# Patient Record
Sex: Female | Born: 1980 | Race: White | Hispanic: No | Marital: Married | Smoking: Current every day smoker
Health system: Southern US, Community
[De-identification: ages and names within clinical notes are randomized; demographics above are authoritative.]

## PROBLEM LIST (undated history)

## (undated) DIAGNOSIS — J45909 Unspecified asthma, uncomplicated: Secondary | ICD-10-CM

---

## 2004-01-24 ENCOUNTER — Emergency Department: Payer: Self-pay | Admitting: Internal Medicine

## 2004-01-26 ENCOUNTER — Emergency Department: Payer: Self-pay | Admitting: Emergency Medicine

## 2004-04-10 ENCOUNTER — Emergency Department: Payer: Self-pay | Admitting: Emergency Medicine

## 2004-04-11 ENCOUNTER — Emergency Department: Payer: Self-pay | Admitting: Emergency Medicine

## 2004-10-29 ENCOUNTER — Observation Stay: Payer: Self-pay

## 2004-12-08 ENCOUNTER — Observation Stay: Payer: Self-pay

## 2004-12-10 ENCOUNTER — Inpatient Hospital Stay: Payer: Self-pay

## 2004-12-18 ENCOUNTER — Emergency Department: Payer: Self-pay | Admitting: Emergency Medicine

## 2006-02-20 ENCOUNTER — Observation Stay: Payer: Self-pay

## 2006-03-20 ENCOUNTER — Observation Stay: Payer: Self-pay

## 2006-03-23 ENCOUNTER — Inpatient Hospital Stay: Payer: Self-pay | Admitting: Obstetrics & Gynecology

## 2007-11-15 ENCOUNTER — Other Ambulatory Visit: Payer: Self-pay

## 2007-11-15 ENCOUNTER — Emergency Department: Payer: Self-pay | Admitting: Emergency Medicine

## 2007-11-24 ENCOUNTER — Ambulatory Visit: Payer: Self-pay | Admitting: Unknown Physician Specialty

## 2015-03-17 ENCOUNTER — Emergency Department
Admission: EM | Admit: 2015-03-17 | Discharge: 2015-03-17 | Disposition: A | Discharge status: Home / Self Care | Payer: Medicaid Other | Attending: Emergency Medicine | Admitting: Emergency Medicine

## 2015-03-17 ENCOUNTER — Encounter: Payer: Self-pay | Admitting: Emergency Medicine

## 2015-03-17 DIAGNOSIS — J101 Influenza due to other identified influenza virus with other respiratory manifestations: Secondary | ICD-10-CM | POA: Diagnosis not present

## 2015-03-17 DIAGNOSIS — R509 Fever, unspecified: Secondary | ICD-10-CM | POA: Diagnosis present

## 2015-03-17 DIAGNOSIS — F17210 Nicotine dependence, cigarettes, uncomplicated: Secondary | ICD-10-CM | POA: Diagnosis not present

## 2015-03-17 DIAGNOSIS — J45901 Unspecified asthma with (acute) exacerbation: Secondary | ICD-10-CM | POA: Insufficient documentation

## 2015-03-17 HISTORY — DX: Unspecified asthma, uncomplicated: J45.909

## 2015-03-17 LAB — RAPID INFLUENZA A&B ANTIGENS: Influenza B (ARMC): NEGATIVE

## 2015-03-17 LAB — RAPID INFLUENZA A&B ANTIGENS (ARMC ONLY): INFLUENZA A (ARMC): POSITIVE

## 2015-03-17 MED ORDER — IBUPROFEN 800 MG PO TABS: 800.0000 mg | ORAL_TABLET | Freq: Three times a day (TID) | ORAL | Status: AC | PRN

## 2015-03-17 MED ORDER — PSEUDOEPH-BROMPHEN-DM 30-2-10 MG/5ML PO SYRP: 5.0000 mL | ORAL_SOLUTION | Freq: Four times a day (QID) | ORAL | Status: AC | PRN

## 2015-03-17 MED ORDER — ONDANSETRON HCL 4 MG/2ML IJ SOLN
4.0000 mg | Freq: Once | INTRAMUSCULAR | Status: AC
  Administered 2015-03-17: 4 mg via INTRAVENOUS
  Filled 2015-03-17: qty 2

## 2015-03-17 MED ORDER — ALBUTEROL SULFATE HFA 108 (90 BASE) MCG/ACT IN AERS: 2.0000 | INHALATION_SPRAY | Freq: Four times a day (QID) | RESPIRATORY_TRACT | Status: AC | PRN

## 2015-03-17 MED ORDER — KETOROLAC TROMETHAMINE 30 MG/ML IJ SOLN
30.0000 mg | Freq: Once | INTRAMUSCULAR | Status: AC
  Administered 2015-03-17: 30 mg via INTRAVENOUS
  Filled 2015-03-17: qty 1

## 2015-03-17 MED ORDER — OSELTAMIVIR PHOSPHATE 75 MG PO CAPS: 75.0000 mg | ORAL_CAPSULE | Freq: Two times a day (BID) | ORAL | Status: AC

## 2015-03-17 MED ORDER — SODIUM CHLORIDE 0.9 % IV BOLUS (SEPSIS)
1000.0000 mL | Freq: Once | INTRAVENOUS | Status: AC
  Administered 2015-03-17: 1000 mL via INTRAVENOUS

## 2015-03-17 NOTE — ED Provider Notes (Signed)
[facility] Emergency Department Provider Note  ____________________________________________  Time seen: Approximately 5:23 PM  I have reviewed the triage vital signs and the nursing notes.   HISTORY  Chief Complaint Fever and Generalized Body Aches    HPI Clarise Chacko is a 35 y.o. female patient complain of fever bodyaches headache, nonproductive cough onset yesterday. She also states she's having some mild wheezing and did not have recurrent inhaler. Patient states she is having nausea, but no vomiting or diarrhea. Patient state there is very mild relief of the fever with ibuprofen and Tylenol. Patient rates her pain discomfort as a 8/10. Describes her pain as "achy".   Past Medical History  Diagnosis Date  . Asthma     There are no active problems to display for this patient.   No past surgical history on file.  Current Outpatient Rx  Name  Route  Sig  Dispense  Refill  . albuterol (PROVENTIL HFA;VENTOLIN HFA) 108 (90 Base) MCG/ACT inhaler   Inhalation   Inhale 2 puffs into the lungs every 6 (six) hours as needed for wheezing or shortness of breath.         . brompheniramine-pseudoephedrine-DM 30-2-10 MG/5ML syrup   Oral   Take 5 mLs by mouth 4 (four) times daily as needed.   120 mL   0   . ibuprofen (ADVIL,MOTRIN) 800 MG tablet   Oral   Take 1 tablet (800 mg total) by mouth every 8 (eight) hours as needed.   30 tablet   0   . oseltamivir (TAMIFLU) 75 MG capsule   Oral   Take 1 capsule (75 mg total) by mouth 2 (two) times daily.   20 capsule   0     Allergies Review of patient's allergies indicates no known allergies.  No family history on file.  Social History Social History  Substance Use Topics  . Smoking status: Current Every Day Smoker -- 0.50 packs/day    Types: Cigarettes  . Smokeless tobacco: Never Used  . Alcohol Use: Yes     Comment: occasionally    Review of Systems Constitutional: Fever and chills  Eyes:  No visual changes. ENT: Sore throat  Cardiovascular: Denies chest pain. Respiratory: Denies shortness of breath. Nonproductive cough Gastrointestinal: No abdominal pain. Nausea, no vomiting.  No diarrhea.  No constipation. Genitourinary: Negative for dysuria. Musculoskeletal: Negative for back pain. Skin: Negative for rash. Neurological: Positive for headaches, but denies focal weakness or numbness. 10-point ROS otherwise negative.  ____________________________________________   PHYSICAL EXAM:  VITAL SIGNS: ED Triage Vitals  Enc Vitals Group     BP 03/17/15 1658 135/74 mmHg     Pulse Rate 03/17/15 1658 116     Resp 03/17/15 1658 16     Temp 03/17/15 1658 99.1 F (37.3 C)     Temp Source 03/17/15 1658 Oral     SpO2 03/17/15 1658 97 %     Weight 03/17/15 1658 245 lb (111.131 kg)     Height 03/17/15 1658  (1.702 m)     Head Cir --      Peak Flow --      Pain Score 03/17/15 1705 8     Pain Loc --      Pain Edu? --      Excl. in GC? --     Constitutional: Alert and oriented. Well appearing and in no acute distress. Eyes: Conjunctivae are normal. PERRL. EOMI. Head: Atraumatic. Nose: Edematous nasal turbinates with clear rhinorrhea Mouth/Throat:  Mucous membranes are moist.  Oropharynx non-erythematous. Postnasal drainage Neck: No stridor.  No cervical spine tenderness to palpation. Hematological/Lymphatic/Immunilogical: No cervical lymphadenopathy. Cardiovascular:  Grossly normal heart sounds.  Good peripheral circulation. Tachycardic Respiratory: Normal respiratory effort.  No retractions. Lungs mild wheezing Gastrointestinal: Soft and nontender. No distention. No abdominal bruits. No CVA tenderness. Musculoskeletal: No lower extremity tenderness nor edema.  No joint effusions. Neurologic:  Normal speech and language. No gross focal neurologic deficits are appreciated. No gait instability. Skin:  Skin is warm, dry and intact. No rash noted. Psychiatric: Mood and affect  are normal. Speech and behavior are normal.  ____________________________________________   LABS (all labs ordered are listed, but only abnormal results are displayed)  Labs Reviewed  RAPID INFLUENZA A&B ANTIGENS ([facility] ONLY)   ____________________________________________  EKG   ____________________________________________  RADIOLOGY   ____________________________________________   PROCEDURES  Procedure(s) performed: None  Critical Care performed: No  ____________________________________________   INITIAL IMPRESSION / ASSESSMENT AND PLAN / ED COURSE  Pertinent labs & imaging results that were available during my care of the patient were reviewed by me and considered in my medical decision making (see chart for details).  Patient was positive for influenza "A". Given a prescription for Tamiflu, Bromfed DM, and ibuprofen. Patient given a school note for 2 days. Patient advised to follow-up family doctor if condition persists. ____________________________________________   FINAL CLINICAL IMPRESSION(S) / ED DIAGNOSES  Final diagnoses:  Influenza A      Joni Reining, PA-C 03/17/15 1831  Sharman Cheek, MD 03/17/15 2118

## 2015-03-17 NOTE — ED Notes (Addendum)
States she developed fever with body aches and headache yesterday  ..had a non prod cough with wheezing  Ran out of inhaler yesterday   States she has had min relief of fever with ibu and tylenol

## 2015-03-17 NOTE — ED Notes (Signed)
States fever since yesterday morning, body aches, headache.  Patient took Ibuprofen at 1430.

## 2015-03-17 NOTE — Discharge Instructions (Signed)
Influenza, Adult Influenza (flu) is an infection in the mouth, nose, and throat (respiratory tract) caused by a virus. The flu can make you feel very ill. Influenza spreads easily from person to person (contagious).  HOME CARE   Only take medicines as told by your doctor.  Use a cool mist humidifier to make breathing easier.  Get plenty of rest until your fever goes away. This usually takes 3 to 4 days.  Drink enough fluids to keep your pee (urine) clear or pale yellow.  Cover your mouth and nose when you cough or sneeze.  Wash your hands well to avoid spreading the flu.  Stay home from work or school until your fever has been gone for at least 1 full day.  Get a flu shot every year. GET HELP RIGHT AWAY IF:   You have trouble breathing or feel short of breath.  Your skin or nails turn blue.  You have severe neck pain or stiffness.  You have a severe headache, facial pain, or earache.  Your fever gets worse or keeps coming back.  You feel sick to your stomach (nauseous), throw up (vomit), or have watery poop (diarrhea).  You have chest pain.  You have a deep cough that gets worse, or you cough up more thick spit (mucus). MAKE SURE YOU:   Understand these instructions.  Will watch your condition.  Will get help right away if you are not doing well or get worse.   This information is not intended to replace advice given to you by your health care provider. Make sure you discuss any questions you have with your health care provider.   Document Released: 11/17/2007 Document Revised: 02/28/2014 Document Reviewed: 05/09/2011 Elsevier Interactive Patient Education 2016 [employer]  

## 2016-06-22 ENCOUNTER — Other Ambulatory Visit: Payer: Self-pay | Admitting: Internal Medicine

## 2016-06-22 DIAGNOSIS — N644 Mastodynia: Secondary | ICD-10-CM

## 2016-06-30 ENCOUNTER — Ambulatory Visit
Admission: RE | Admit: 2016-06-30 | Discharge: 2016-06-30 | Disposition: A | Discharge status: Home / Self Care | Payer: Medicaid Other | Source: Ambulatory Visit | Attending: Internal Medicine | Admitting: Internal Medicine

## 2016-06-30 DIAGNOSIS — N644 Mastodynia: Secondary | ICD-10-CM | POA: Insufficient documentation

## 2018-06-17 ENCOUNTER — Other Ambulatory Visit: Payer: Self-pay

## 2018-06-17 ENCOUNTER — Emergency Department
Admission: EM | Admit: 2018-06-17 | Discharge: 2018-06-17 | Disposition: A | Discharge status: Home / Self Care | Payer: Medicaid Other | Attending: Emergency Medicine | Admitting: Emergency Medicine

## 2018-06-17 DIAGNOSIS — Y9389 Activity, other specified: Secondary | ICD-10-CM | POA: Diagnosis not present

## 2018-06-17 DIAGNOSIS — S0181XA Laceration without foreign body of other part of head, initial encounter: Secondary | ICD-10-CM | POA: Diagnosis present

## 2018-06-17 DIAGNOSIS — F17210 Nicotine dependence, cigarettes, uncomplicated: Secondary | ICD-10-CM | POA: Insufficient documentation

## 2018-06-17 DIAGNOSIS — Y998 Other external cause status: Secondary | ICD-10-CM | POA: Insufficient documentation

## 2018-06-17 DIAGNOSIS — Z23 Encounter for immunization: Secondary | ICD-10-CM | POA: Diagnosis not present

## 2018-06-17 DIAGNOSIS — Y9289 Other specified places as the place of occurrence of the external cause: Secondary | ICD-10-CM | POA: Insufficient documentation

## 2018-06-17 MED ORDER — LIDOCAINE-EPINEPHRINE (PF) 2 %-1:200000 IJ SOLN
10.0000 mL | Freq: Once | INTRAMUSCULAR | Status: AC
  Administered 2018-06-17: 10 mL
  Filled 2018-06-17: qty 10

## 2018-06-17 MED ORDER — TETANUS-DIPHTH-ACELL PERTUSSIS 5-2.5-18.5 LF-MCG/0.5 IM SUSP
0.5000 mL | Freq: Once | INTRAMUSCULAR | Status: AC
  Administered 2018-06-17: 0.5 mL via INTRAMUSCULAR
  Filled 2018-06-17: qty 0.5
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
## 2018-06-17 NOTE — ED Notes (Signed)
Pt unable to sign discharge as chart was locked by registration.  Pt verbalized understanding of all information shared.

## 2018-06-17 NOTE — Discharge Instructions (Addendum)
Follow-up with your regular doctor, return to the ER, or remove your sutures and 1 week.  If there is any sign of infection please return the emergency department.  Your tetanus is good for 10 years unless you get hurt.  Clean the area with soap and water only.  He may apply a small amount of Vaseline or Neosporin to the suture line.  I would prefer Vaseline as sometimes the Neosporin reacts with the sutures.

## 2018-06-17 NOTE — ED Notes (Signed)
Darl Pikes, PA at bedside for laceration repair
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
## 2020-11-28 ENCOUNTER — Other Ambulatory Visit: Payer: Self-pay

## 2020-11-28 ENCOUNTER — Emergency Department
Admission: EM | Admit: 2020-11-28 | Discharge: 2020-11-28 | Disposition: A | Discharge status: Home / Self Care | Payer: Medicaid Other | Attending: Emergency Medicine | Admitting: Emergency Medicine

## 2020-11-28 ENCOUNTER — Encounter: Payer: Self-pay | Admitting: Emergency Medicine

## 2020-11-28 ENCOUNTER — Emergency Department: Payer: Medicaid Other

## 2020-11-28 DIAGNOSIS — J45909 Unspecified asthma, uncomplicated: Secondary | ICD-10-CM | POA: Diagnosis not present

## 2020-11-28 DIAGNOSIS — M25571 Pain in right ankle and joints of right foot: Secondary | ICD-10-CM | POA: Insufficient documentation

## 2020-11-28 DIAGNOSIS — F17210 Nicotine dependence, cigarettes, uncomplicated: Secondary | ICD-10-CM | POA: Diagnosis not present

## 2020-11-28 DIAGNOSIS — M25572 Pain in left ankle and joints of left foot: Secondary | ICD-10-CM | POA: Insufficient documentation

## 2020-11-28 DIAGNOSIS — W109XXA Fall (on) (from) unspecified stairs and steps, initial encounter: Secondary | ICD-10-CM | POA: Insufficient documentation

## 2020-11-28 MED ORDER — MELOXICAM 15 MG PO TABS: 15.0000 mg | ORAL_TABLET | Freq: Every day | ORAL | 2 refills | Status: AC

## 2020-11-28 NOTE — ED Triage Notes (Signed)
Pt via POV from home. Pt had a mechanical fall last night down 1 step. Pt c/o bilateral ankle pain. Both ankles are swollen. Denies head injury. Denies LOC. Pt is A&Ox4 and Nad.

## 2020-11-28 NOTE — ED Provider Notes (Signed)
[facility]-EMERGENCY DEPARTMENT  ____________________________________________  Time seen: Approximately 7:57 PM  I have reviewed the triage vital signs and the nursing notes.   HISTORY  Chief Complaint Ankle Pain   Historian Patient     HPI Rhonda Russell is a 40 y.o. female presents to the emergency department after patient had a mechanical fall down 1 step last night and is complaining of bilateral ankle pain.  She is had difficulty bearing weight since injury occurred.  She did not hit her head or neck.  No numbness or tingling in the upper and lower extremities.  No abrasions or lacerations.   Past Medical History:  Diagnosis Date   Asthma      Immunizations up to date:  Yes.     Past Medical History:  Diagnosis Date   Asthma     There are no problems to display for this patient.   No past surgical history on file.  Prior to Admission medications   Medication Sig Start Date End Date Taking? Authorizing Provider  meloxicam (MOBIC) 15 MG tablet Take 1 tablet (15 mg total) by mouth daily. 11/28/20 11/28/21 Yes Pia Mau M, PA-C  albuterol (PROVENTIL HFA;VENTOLIN HFA) 108 (90 Base) MCG/ACT inhaler Inhale 2 puffs into the lungs every 6 (six) hours as needed for wheezing or shortness of breath.    [provider]  albuterol (PROVENTIL HFA;VENTOLIN HFA) 108 (90 Base) MCG/ACT inhaler Inhale 2 puffs into the lungs every 6 (six) hours as needed for wheezing or shortness of breath. 03/17/15   Joni Reining, PA-C  brompheniramine-pseudoephedrine-DM 30-2-10 MG/5ML syrup Take 5 mLs by mouth 4 (four) times daily as needed. 03/17/15   Joni Reining, PA-C  ibuprofen (ADVIL,MOTRIN) 800 MG tablet Take 1 tablet (800 mg total) by mouth every 8 (eight) hours as needed. 03/17/15   Joni Reining, PA-C    Allergies Patient has no known allergies.  Family History  Family history unknown: Yes    Social History Social History   Tobacco Use   Smoking status: Every Day     Packs/day: 1.00    Types: Cigarettes   Smokeless tobacco: Never  Substance Use Topics   Alcohol use: Yes    Comment: occasionally   Drug use: No     Review of Systems  Constitutional: No fever/chills Eyes:  No discharge ENT: No upper respiratory complaints. Respiratory: no cough. No SOB/ use of accessory muscles to breath Gastrointestinal:   No nausea, no vomiting.  No diarrhea.  No constipation. Musculoskeletal: Patient has bilateral ankle pain.  Skin: Negative for rash, abrasions, lacerations, ecchymosis.    ____________________________________________   PHYSICAL EXAM:  VITAL SIGNS: ED Triage Vitals  Enc Vitals Group     BP 11/28/20 1707 (!) 148/69     Pulse Rate 11/28/20 1707 85     Resp 11/28/20 1707 18     Temp 11/28/20 1707 98.6 F (37 C)     Temp Source 11/28/20 1707 Oral     SpO2 11/28/20 1707 99 %     Weight 11/28/20 1704 240 lb (108.9 kg)     Height 11/28/20 1704 5\' 6"  (1.676 m)     Head Circumference --      Peak Flow --      Pain Score 11/28/20 1704 5     Pain Loc --      Pain Edu? --      Excl. in GC? --      Constitutional: Alert and oriented. Well appearing and  in no acute distress. Eyes: Conjunctivae are normal. PERRL. EOMI. Head: Atraumatic. ENT:  Cardiovascular: Normal rate, regular rhythm. Normal S1 and S2.  Good peripheral circulation. Respiratory: Normal respiratory effort without tachypnea or retractions. Lungs CTAB. Good air entry to the bases with no decreased or absent breath sounds Gastrointestinal: Bowel sounds x 4 quadrants. Soft and nontender to palpation. No guarding or rigidity. No distention. Musculoskeletal: Patient has pain to palpation over the anterior posterior talofibular ligaments bilaterally.  Palpable dorsalis pedis pulse bilaterally and symmetrically.  Capillary refill less than 2 seconds bilaterally and symmetrically. Neurologic:  Normal for age. No gross focal neurologic deficits are appreciated.  Skin:  Skin is  warm, dry and intact. No rash noted. Psychiatric: Mood and affect are normal for age. Speech and behavior are normal.   ____________________________________________   LABS (all labs ordered are listed, but only abnormal results are displayed)  Labs Reviewed - No data to display ____________________________________________  EKG   ____________________________________________  RADIOLOGY Geraldo Pitter, personally viewed and evaluated these images (plain radiographs) as part of my medical decision making, as well as reviewing the written report by the radiologist.  DG Ankle Complete Left  Result Date: 11/28/2020 CLINICAL DATA:  Larey Seat, left ankle pain EXAM: LEFT ANKLE COMPLETE - 3+ VIEW COMPARISON:  None. FINDINGS: Frontal, oblique, lateral views of the left ankle are obtained. Diffuse soft tissue swelling greatest anteriorly and laterally. No acute fracture, subluxation, or dislocation. Joint spaces are well preserved. IMPRESSION: 1. Anterolateral soft tissue swelling.  No acute fracture. Electronically Signed   By: Sharlet Salina M.D.   On: 11/28/2020 17:48   DG Ankle Complete Right  Result Date: 11/28/2020 CLINICAL DATA:  Larey Seat yesterday, pain EXAM: RIGHT ANKLE - COMPLETE 3+ VIEW COMPARISON:  None FINDINGS: Frontal, oblique, lateral views of the right ankle demonstrate diffuse soft tissue swelling greatest anteriorly and laterally. There is a small ossific density interposed between the tip of the lateral malleolus and the talus, consistent with a small avulsion fracture. No other acute bony abnormalities. Joint spaces are well preserved. IMPRESSION: 1. Small avulsion fracture between the tip of the lateral malleolus and talus. Donor site is not definitively identified. 2. Prominent soft tissue swelling greatest laterally. Electronically Signed   By: Sharlet Salina M.D.   On: 11/28/2020 17:48    ____________________________________________    PROCEDURES  Procedure(s) performed:      Procedures     Medications - No data to display   ____________________________________________   INITIAL IMPRESSION / ASSESSMENT AND PLAN / ED COURSE  Pertinent labs & imaging results that were available during my care of the patient were reviewed by me and considered in my medical decision making (see chart for details).    Assessment and plan:  Ankle pain 40 year old female presents to the emergency department with bilateral ankle pain.  Right ankle shows possible tiny bony avulsion from either the lateral malleolus or the talus.  Left ankle shows no acute bony abnormality.  An Ace wrap was applied over both ankles and a walker was provided.  Patient was discharged with a prescription for meloxicam and she was advised to follow-up with podiatry as needed.      ____________________________________________  FINAL CLINICAL IMPRESSION(S) / ED DIAGNOSES  Final diagnoses:  Acute bilateral ankle pain      NEW MEDICATIONS STARTED DURING THIS VISIT:  ED Discharge Orders          Ordered    meloxicam (MOBIC) 15 MG tablet  Daily  11/28/20 1801                This chart was dictated using voice recognition software/Dragon. Despite best efforts to proofread, errors can occur which can change the meaning. Any change was purely unintentional.     Orvil Feil, PA-C 11/28/20 Babette Relic    Phineas Semen, MD 11/28/20 [phone]

## 2022-06-02 IMAGING — DX DG ANKLE COMPLETE 3+V*R*
3 series · 3 of 3 positions shown · non-contrast
Comparison: None

CLINICAL DATA: Fell yesterday, pain

EXAM:
RIGHT ANKLE - COMPLETE 3+ VIEW

[ankle ap]
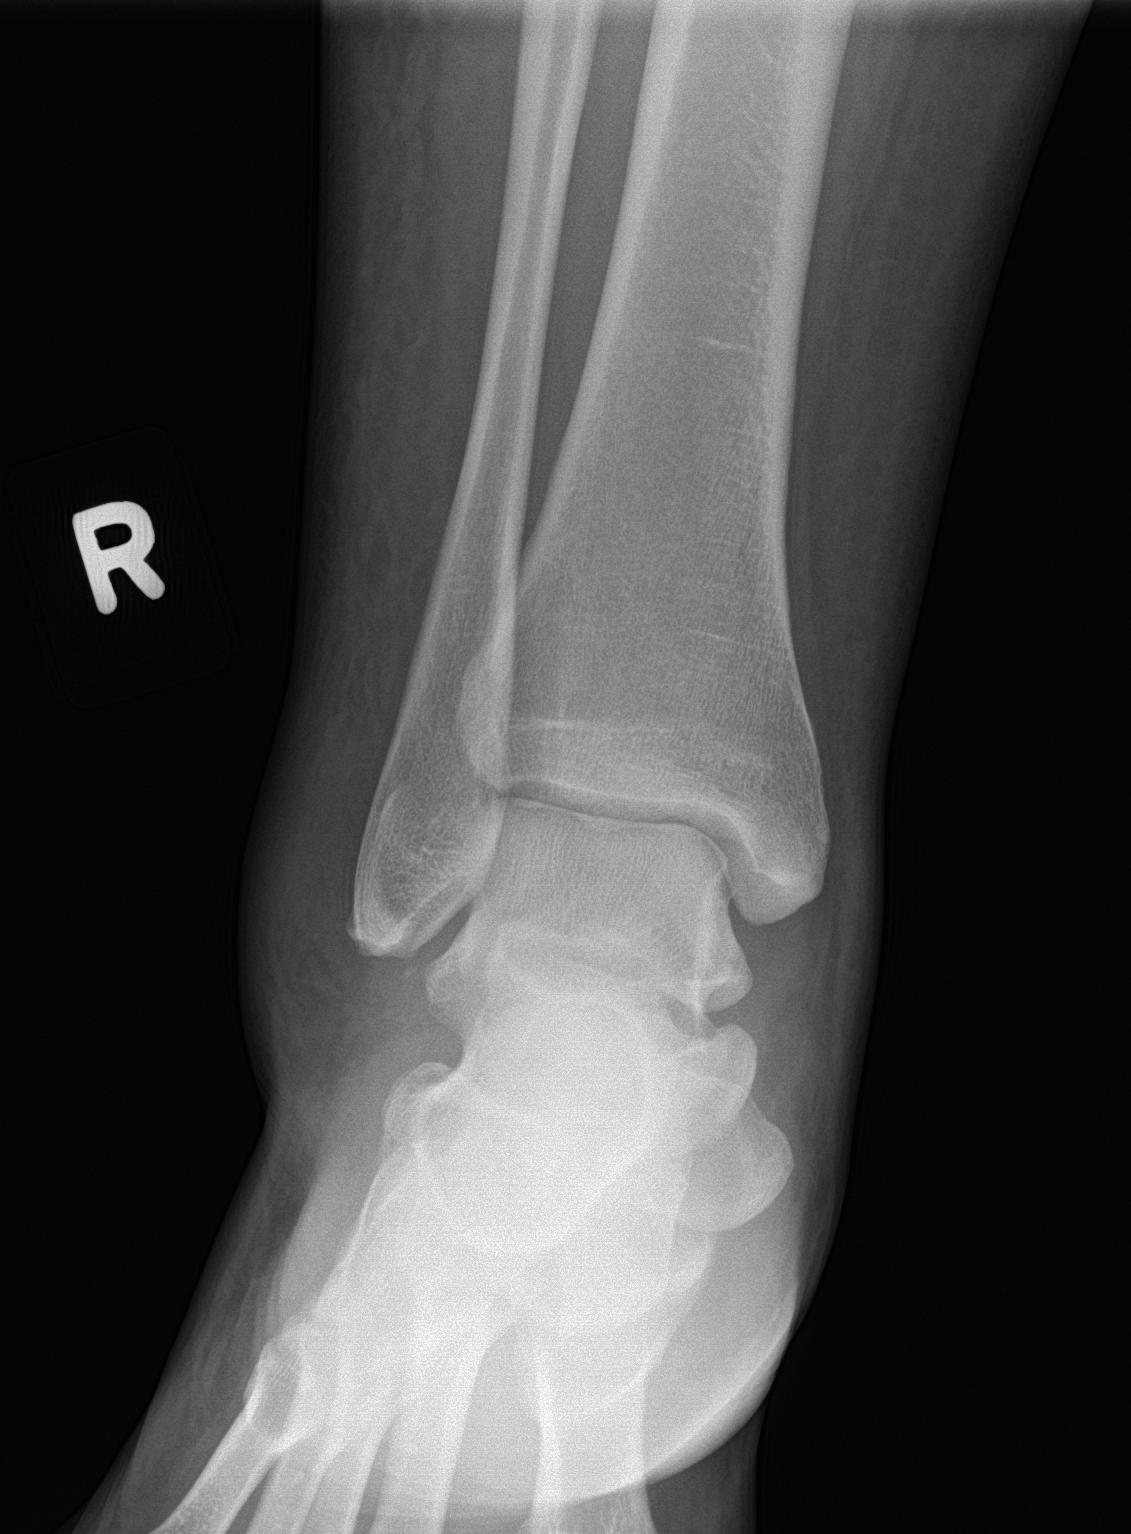

[ankle obl]
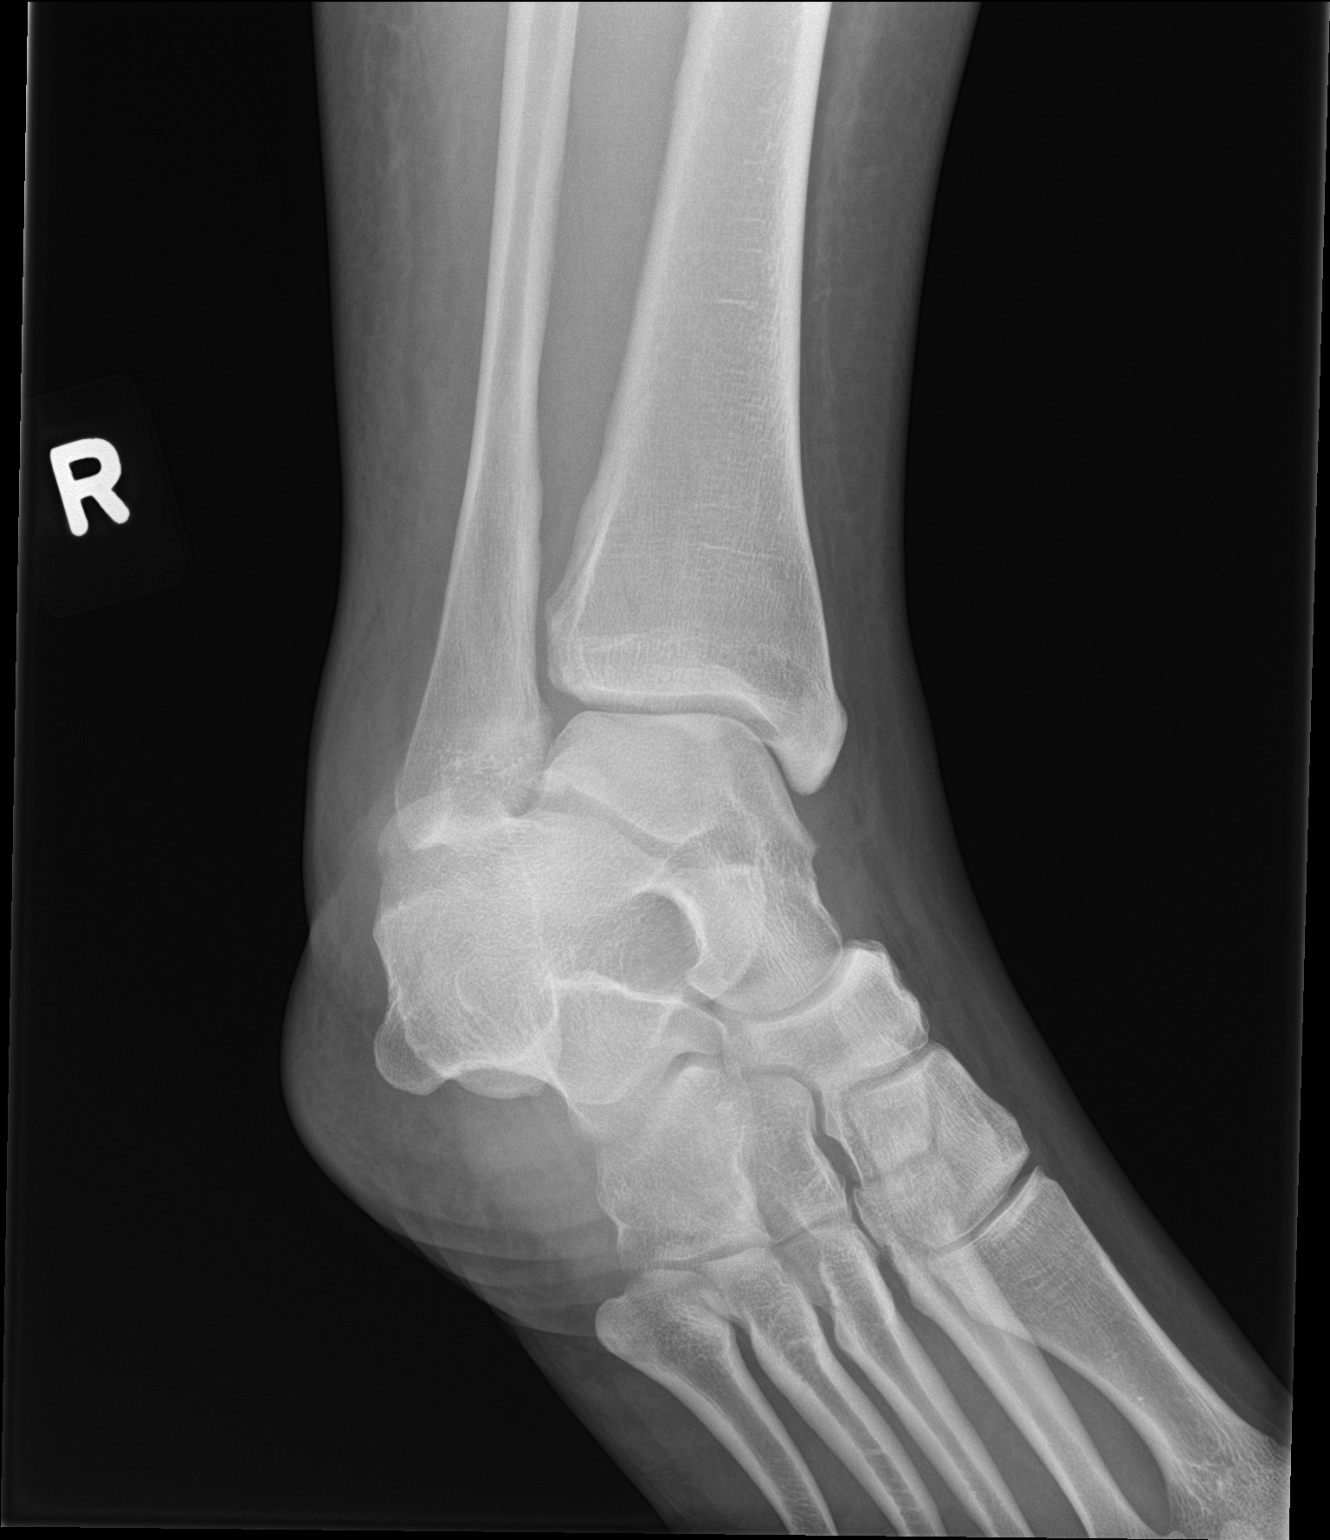

[ankle lat]
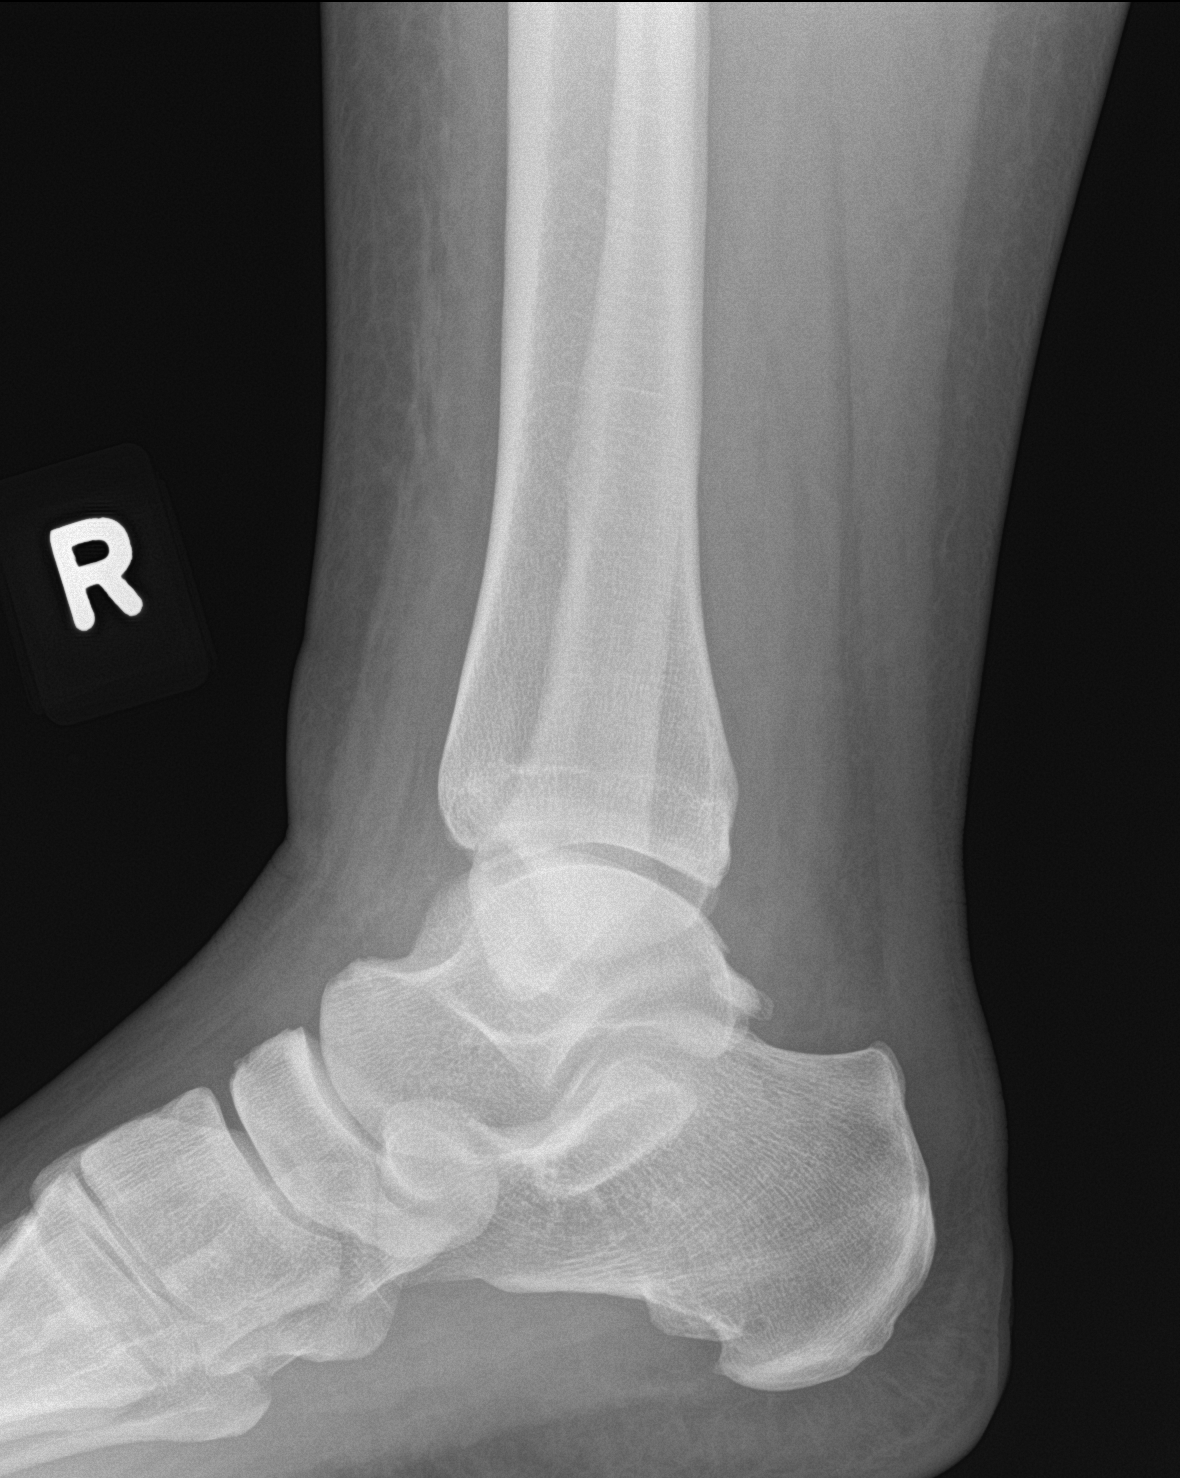

[3 of 3 positions shown; findings below may reference images not displayed]

FINDINGS: Frontal, oblique, lateral views of the right ankle demonstrate
diffuse soft tissue swelling greatest anteriorly and laterally.
There is a small ossific density interposed between the tip of the
lateral malleolus and the talus, consistent with a small avulsion
fracture. No other acute bony abnormalities. Joint spaces are well
preserved.
IMPRESSION: 1. Small avulsion fracture between the tip of the lateral malleolus
and talus. Donor site is not definitively identified.
2. Prominent soft tissue swelling greatest laterally.
